# Patient Record
Sex: Male | Born: 1985 | Race: Black or African American | Hispanic: No | Marital: Single | Smoking: Never smoker
Health system: Southern US, Community
[De-identification: ages and names within clinical notes are randomized; demographics above are authoritative.]

---

## 2017-12-07 ENCOUNTER — Other Ambulatory Visit: Payer: Self-pay | Admitting: Obstetrics & Gynecology

## 2017-12-07 DIAGNOSIS — Z832 Family history of diseases of the blood and blood-forming organs and certain disorders involving the immune mechanism: Secondary | ICD-10-CM

## 2017-12-07 NOTE — Progress Notes (Signed)
Patient is here for hemoglobin evaluation; his fiancee who is pregnant has alpha thalassemia trait.  Will follow up results and manage accordingly.  Jaynie Collins, MD, FACOG Obstetrician & Gynecologist, [facility] for [employer], [facility]

## 2017-12-09 LAB — HEMOGLOBINOPATHY EVALUATION
HGB C: 0 %
HGB S: 0 %
HGB VARIANT: 0 %
Hemoglobin A2 Quantitation: 2.1 % (ref 1.8–3.2)
Hemoglobin F Quantitation: 0 % (ref 0.0–2.0)
Hgb A: 97.9 % (ref 96.4–98.8)

## 2017-12-13 ENCOUNTER — Telehealth: Payer: Self-pay

## 2017-12-13 NOTE — Telephone Encounter (Signed)
Notified pt's that the FOB results are negative.  Pt stated understanding with no further questions.

## 2017-12-13 NOTE — Telephone Encounter (Signed)
-----   Message from Tereso Newcomer, MD sent at 12/12/2017  1:20 PM EDT ----- Normal Hemoglobin AA. He is patient's FOB (patient with thalessemia trait). No further testing needed. Please call to inform patient of results and recommendations.

## 2018-01-29 ENCOUNTER — Encounter (HOSPITAL_BASED_OUTPATIENT_CLINIC_OR_DEPARTMENT_OTHER): Payer: Self-pay | Admitting: *Deleted

## 2018-01-29 ENCOUNTER — Emergency Department (HOSPITAL_BASED_OUTPATIENT_CLINIC_OR_DEPARTMENT_OTHER)
Admission: EM | Admit: 2018-01-29 | Discharge: 2018-01-29 | Disposition: A | Discharge status: Home / Self Care | Payer: Self-pay | Attending: Emergency Medicine | Admitting: Emergency Medicine

## 2018-01-29 ENCOUNTER — Other Ambulatory Visit: Payer: Self-pay

## 2018-01-29 DIAGNOSIS — K029 Dental caries, unspecified: Secondary | ICD-10-CM | POA: Insufficient documentation

## 2018-01-29 MED ORDER — KETOROLAC TROMETHAMINE 30 MG/ML IJ SOLN
30.0000 mg | Freq: Once | INTRAMUSCULAR | Status: AC
  Administered 2018-01-29: 30 mg via INTRAMUSCULAR
  Filled 2018-01-29: qty 1

## 2018-01-29 MED ORDER — NAPROXEN 500 MG PO TABS: 500.0000 mg | ORAL_TABLET | Freq: Two times a day (BID) | ORAL | 0 refills | Status: DC

## 2018-01-29 MED ORDER — PENICILLIN V POTASSIUM 500 MG PO TABS: 500.0000 mg | ORAL_TABLET | Freq: Four times a day (QID) | ORAL | 0 refills | Status: AC

## 2018-01-29 NOTE — ED Triage Notes (Signed)
Left upper toothache x 3 days

## 2018-01-29 NOTE — ED Provider Notes (Signed)
[facility] EMERGENCY DEPARTMENT Provider Note   CSN: [id number] Arrival date & time: 01/29/18  1953     History   Chief Complaint Chief Complaint  Patient presents with  . Dental Pain    HPI Mark Bryant is a 32 y.o. male presenting to the ED complaining of dental pain located left upper molar. Pt states pain began 3 days ago, described as throbbing and radiating to his lower jaw. He reports assoc bad taste in his mouth. Has taken tylenol and ibuprofen with mild relief of symptoms. Denies difficulty swallowing or breathing, fever, chills, N/V, or other symptoms today. Does not have a dentist.   The history is provided by the patient.    History reviewed. No pertinent past medical history.  There are no active problems to display for this patient.   History reviewed. No pertinent surgical history.      Home Medications    Prior to Admission medications   Medication Sig Start Date End Date Taking? Authorizing Provider  naproxen (NAPROSYN) 500 MG tablet Take 1 tablet (500 mg total) by mouth 2 (two) times daily with a meal. 01/29/18   Robinson, [place] N, PA-C  penicillin v potassium (VEETID) 500 MG tablet Take 1 tablet (500 mg total) by mouth 4 (four) times daily for 7 days. 01/29/18 02/05/18  Robinson, [place] N, PA-C    Family History No family history on file.  Social History Social History   Tobacco Use  . Smoking status: Never Smoker  . Smokeless tobacco: Never Used  Substance Use Topics  . Alcohol use: Yes  . Drug use: Yes    Types: Marijuana     Allergies   Patient has no known allergies.   Review of Systems Review of Systems  Constitutional: Negative for fever.  HENT: Positive for dental problem.      Physical Exam Updated Vital Signs BP (!) 128/91 (BP Location: Left Arm)   Pulse (!) 55   Temp 98.6 F (37 C) (Oral)   Resp 18   Ht 6\' 1"  (1.854 m)   Wt 131.5 kg   SpO2 100%   BMI 38.26 kg/m   Physical Exam  Constitutional:  He appears well-developed and well-nourished.  HENT:  Head: Normocephalic and atraumatic.  Multiple silver fillings. 2nd to last left upper molar with filling and small hole. Tooth is tender. No gingival abscess. No sublingual edema or tenderness. Uvula midline, no trismus.  Eyes: Conjunctivae are normal.  Neck: Normal range of motion. Neck supple.  Cardiovascular:  slightly bradycardic   Pulmonary/Chest: Effort normal.  Lymphadenopathy:    He has no cervical adenopathy.  Psychiatric: He has a normal mood and affect. His behavior is normal.  Nursing note and vitals reviewed.    ED Treatments / Results  Labs (all labs ordered are listed, but only abnormal results are displayed) Labs Reviewed - No data to display  EKG None  Radiology No results found.  Procedures Procedures (including critical care time)  Medications Ordered in ED Medications  ketorolac (TORADOL) 30 MG/ML injection 30 mg (has no administration in time range)     Initial Impression / Assessment and Plan / ED Course  I have reviewed the triage vital signs and the nursing notes.  Pertinent labs & imaging results that were available during my care of the patient were reviewed by me and considered in my medical decision making (see chart for details).    Patient with dental caries.  No gross abscess.  VSS, afebrile,  tolerating secretions. Exam unconcerning for peritonsillar abscess, Ludwig's angina or spread of infection.  Will treat with penicillin and pain medicine.  Urged patient to follow-up with dentist. Pt safe for discharge.  Discussed results, findings, treatment and follow up. Patient advised of return precautions. Patient verbalized understanding and agreed with plan.  Final Clinical Impressions(s) / ED Diagnoses   Final diagnoses:  Pain due to dental caries    ED Discharge Orders         Ordered    naproxen (NAPROSYN) 500 MG tablet  2 times daily with meals     01/29/18 2030    penicillin  v potassium (VEETID) 500 MG tablet  4 times daily     01/29/18 2030           Robinson, [place] N, PA-C 01/29/18 2034    Loren Racer, MD 01/29/18 2330

## 2018-01-29 NOTE — Discharge Instructions (Signed)
Please read instructions below. °Take the antibiotic, Penicillin V, 4 times per day until they are gone. °You can take Naproxen up to 2 times per day with meals, as needed for pain. °Schedule an appointment with a dentist, using the dental resource guide attached. °Return to the ER for difficulty swallowing or breathing, fever, or new or worsening symptoms. ° °

## 2018-01-29 NOTE — ED Notes (Signed)
Pt understood dc material. NAD noted. Scripts given at dc 

## 2019-09-20 ENCOUNTER — Emergency Department (HOSPITAL_BASED_OUTPATIENT_CLINIC_OR_DEPARTMENT_OTHER): Payer: Self-pay

## 2019-09-20 ENCOUNTER — Other Ambulatory Visit: Payer: Self-pay

## 2019-09-20 ENCOUNTER — Encounter (HOSPITAL_BASED_OUTPATIENT_CLINIC_OR_DEPARTMENT_OTHER): Payer: Self-pay | Admitting: Emergency Medicine

## 2019-09-20 ENCOUNTER — Emergency Department (HOSPITAL_BASED_OUTPATIENT_CLINIC_OR_DEPARTMENT_OTHER)
Admission: EM | Admit: 2019-09-20 | Discharge: 2019-09-20 | Disposition: A | Discharge status: Home / Self Care | Payer: Self-pay | Attending: Emergency Medicine | Admitting: Emergency Medicine

## 2019-09-20 DIAGNOSIS — F1210 Cannabis abuse, uncomplicated: Secondary | ICD-10-CM | POA: Insufficient documentation

## 2019-09-20 DIAGNOSIS — S161XXA Strain of muscle, fascia and tendon at neck level, initial encounter: Secondary | ICD-10-CM

## 2019-09-20 DIAGNOSIS — M546 Pain in thoracic spine: Secondary | ICD-10-CM | POA: Insufficient documentation

## 2019-09-20 DIAGNOSIS — M25511 Pain in right shoulder: Secondary | ICD-10-CM

## 2019-09-20 DIAGNOSIS — M791 Myalgia, unspecified site: Secondary | ICD-10-CM | POA: Insufficient documentation

## 2019-09-20 DIAGNOSIS — Y92410 Unspecified street and highway as the place of occurrence of the external cause: Secondary | ICD-10-CM | POA: Insufficient documentation

## 2019-09-20 DIAGNOSIS — Y999 Unspecified external cause status: Secondary | ICD-10-CM | POA: Insufficient documentation

## 2019-09-20 DIAGNOSIS — R52 Pain, unspecified: Secondary | ICD-10-CM

## 2019-09-20 DIAGNOSIS — Y939 Activity, unspecified: Secondary | ICD-10-CM | POA: Insufficient documentation

## 2019-09-20 DIAGNOSIS — Z041 Encounter for examination and observation following transport accident: Secondary | ICD-10-CM | POA: Insufficient documentation

## 2019-09-20 DIAGNOSIS — G8929 Other chronic pain: Secondary | ICD-10-CM | POA: Insufficient documentation

## 2019-09-20 DIAGNOSIS — M25561 Pain in right knee: Secondary | ICD-10-CM

## 2019-09-20 MED ORDER — KETOROLAC TROMETHAMINE 15 MG/ML IJ SOLN
15.0000 mg | Freq: Once | INTRAMUSCULAR | Status: DC
  Filled 2019-09-20: qty 1

## 2019-09-20 MED ORDER — HYDROMORPHONE HCL 1 MG/ML IJ SOLN
1.0000 mg | Freq: Once | INTRAMUSCULAR | Status: AC
  Administered 2019-09-20: 1 mg via INTRAVENOUS
  Filled 2019-09-20: qty 1

## 2019-09-20 MED ORDER — HYDROMORPHONE HCL 1 MG/ML IJ SOLN
1.0000 mg | Freq: Once | INTRAMUSCULAR | Status: DC
  Filled 2019-09-20: qty 1

## 2019-09-20 MED ORDER — LORAZEPAM 2 MG/ML IJ SOLN
2.0000 mg | Freq: Once | INTRAMUSCULAR | Status: DC
  Filled 2019-09-20: qty 1

## 2019-09-20 MED ORDER — DIAZEPAM 5 MG PO TABS: 5.0000 mg | ORAL_TABLET | Freq: Three times a day (TID) | ORAL | 0 refills | Status: AC | PRN

## 2019-09-20 MED ORDER — OXYCODONE-ACETAMINOPHEN 5-325 MG PO TABS: 1.0000 | ORAL_TABLET | Freq: Four times a day (QID) | ORAL | 0 refills | Status: AC | PRN

## 2019-09-20 NOTE — Discharge Instructions (Signed)
It is wonderful to meet you today.  The CT of your neck and x-rays of your shoulder and knee were normal.  We sent in higher strength pain and muscle spasm medications for you to use.  Also recommend using Tylenol and ibuprofen on a scheduled basis.  Ice/heat to your shoulder/knee for 20 minutes at a time several times a day.  Please call the sports medicine clinic tomorrow morning to schedule follow-up visit.  Follow-up if pain not improving or sooner if worsening, any weakness/numbness.
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
## 2019-09-20 NOTE — ED Notes (Signed)
Pt states he does not want any more medication. He wants to go home. MD made aware.
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
## 2019-09-20 NOTE — ED Notes (Signed)
Patient transported to CT 

## 2020-07-17 ENCOUNTER — Emergency Department (HOSPITAL_BASED_OUTPATIENT_CLINIC_OR_DEPARTMENT_OTHER)
Admission: EM | Admit: 2020-07-17 | Discharge: 2020-07-17 | Disposition: A | Discharge status: Home / Self Care | Payer: Self-pay | Attending: Emergency Medicine | Admitting: Emergency Medicine

## 2020-07-17 ENCOUNTER — Emergency Department (HOSPITAL_BASED_OUTPATIENT_CLINIC_OR_DEPARTMENT_OTHER): Payer: Self-pay

## 2020-07-17 ENCOUNTER — Encounter (HOSPITAL_BASED_OUTPATIENT_CLINIC_OR_DEPARTMENT_OTHER): Payer: Self-pay

## 2020-07-17 ENCOUNTER — Other Ambulatory Visit: Payer: Self-pay

## 2020-07-17 DIAGNOSIS — X500XXA Overexertion from strenuous movement or load, initial encounter: Secondary | ICD-10-CM | POA: Insufficient documentation

## 2020-07-17 DIAGNOSIS — R079 Chest pain, unspecified: Secondary | ICD-10-CM

## 2020-07-17 DIAGNOSIS — M549 Dorsalgia, unspecified: Secondary | ICD-10-CM | POA: Insufficient documentation

## 2020-07-17 DIAGNOSIS — R0781 Pleurodynia: Secondary | ICD-10-CM | POA: Insufficient documentation

## 2020-07-17 LAB — CBC
HCT: 43.7 % (ref 39.0–52.0)
Hemoglobin: 13.5 g/dL (ref 13.0–17.0)
MCH: 27.6 pg (ref 26.0–34.0)
MCHC: 30.9 g/dL (ref 30.0–36.0)
MCV: 89.2 fL (ref 80.0–100.0)
Platelets: 282 10*3/uL (ref 150–400)
RBC: 4.9 MIL/uL (ref 4.22–5.81)
RDW: 12.9 % (ref 11.5–15.5)
WBC: 8.4 10*3/uL (ref 4.0–10.5)
nRBC: 0 % (ref 0.0–0.2)

## 2020-07-17 LAB — BASIC METABOLIC PANEL
Anion gap: 10 (ref 5–15)
BUN: 13 mg/dL (ref 6–20)
CO2: 24 mmol/L (ref 22–32)
Calcium: 9.3 mg/dL (ref 8.9–10.3)
Chloride: 100 mmol/L (ref 98–111)
Creatinine, Ser: 1.07 mg/dL (ref 0.61–1.24)
GFR, Estimated: >60 mL/min (ref >60)
Glucose, Bld: 100 mg/dL — ABNORMAL HIGH (ref 70–99)
Potassium: 3.8 mmol/L (ref 3.5–5.1)
Sodium: 134 mmol/L — ABNORMAL LOW (ref 135–145)

## 2020-07-17 LAB — RAPID URINE DRUG SCREEN, HOSP PERFORMED
Amphetamines: NOT DETECTED
Barbiturates: NOT DETECTED
Benzodiazepines: NOT DETECTED
Cocaine: NOT DETECTED
Opiates: NOT DETECTED
Tetrahydrocannabinol: POSITIVE — AB

## 2020-07-17 LAB — TROPONIN I (HIGH SENSITIVITY): Troponin I (High Sensitivity): 6 ng/L (ref <18)

## 2020-07-17 MED ORDER — ACETAMINOPHEN 500 MG PO TABS
1000.0000 mg | ORAL_TABLET | Freq: Once | ORAL | Status: AC
  Administered 2020-07-17: 1000 mg via ORAL
  Filled 2020-07-17: qty 2

## 2020-07-17 MED ORDER — KETOROLAC TROMETHAMINE 30 MG/ML IJ SOLN
30.0000 mg | Freq: Once | INTRAMUSCULAR | Status: AC
  Administered 2020-07-17: 30 mg via INTRAVENOUS
  Filled 2020-07-17: qty 1

## 2020-07-17 MED ORDER — FENTANYL CITRATE (PF) 100 MCG/2ML IJ SOLN
50.0000 ug | INTRAMUSCULAR | Status: DC | PRN
  Administered 2020-07-17: 50 ug via INTRAVENOUS
  Filled 2020-07-17: qty 2

## 2020-07-17 MED ORDER — METHOCARBAMOL 500 MG PO TABS
500.0000 mg | ORAL_TABLET | Freq: Two times a day (BID) | ORAL | 0 refills | Status: DC
Start: 2020-07-17 — End: 2022-02-21

## 2020-07-17 NOTE — ED Notes (Signed)
Patient transported to X-ray 

## 2020-07-17 NOTE — ED Triage Notes (Signed)
States chest pain began this morning in his left chest radiating to his back.

## 2020-07-17 NOTE — Discharge Instructions (Signed)
Your work-up today was reassuring.  Please drink plenty of water.  Use warm compresses to your side.  Use the muscle relaxer as prescribed.  Please use Tylenol or ibuprofen for pain.  You may use 600 mg ibuprofen every 6 hours or 1000 mg of Tylenol every 6 hours.  You may choose to alternate between the 2.  This would be most effective.  Not to exceed 4 g of Tylenol within 24 hours.  Not to exceed 3200 mg ibuprofen 24 hours.

## 2020-07-17 NOTE — ED Provider Notes (Signed)
[facility] EMERGENCY DEPARTMENT Provider Note   CSN: [id number] Arrival date & time: 07/17/20  1737     History Chief Complaint  Patient presents with  . Chest Pain    Mark Bryant is a 35 y.o. male.  HPI Patient 35 year old male.  Presented today for chest pain.  He states that it began on Sunday when he struck a table into the left side of his chest.  While moving furniture.  He states that since that time he has had some achy pain.  He states that today he got much worse however.  He states that it began this morning feeling worse.  He describes as sharp and achy.  Seems to also have some pain in his left side of his back.  Denies any nausea vomiting diaphoresis, shortness of breath, lightheadedness dizziness. No recent surgeries, hospitalization, long travel, hemoptysis, estrogen containing OCP, cancer history.  No unilateral leg swelling.  No history of PE or VTE.      History reviewed. No pertinent past medical history.  There are no problems to display for this patient.   History reviewed. No pertinent surgical history.     History reviewed. No pertinent family history.  Social History   Tobacco Use  . Smoking status: Never Smoker  . Smokeless tobacco: Never Used  Vaping Use  . Vaping Use: Never used  Substance Use Topics  . Alcohol use: Yes  . Drug use: Yes    Types: Marijuana    Home Medications Prior to Admission medications   Medication Sig Start Date End Date Taking? Authorizing Provider  methocarbamol (ROBAXIN) 500 MG tablet Take 1 tablet (500 mg total) by mouth 2 (two) times daily. 07/17/20  Yes Rolf Fells, Stevphen Meuse S, PA  naproxen (NAPROSYN) 500 MG tablet Take 1 tablet (500 mg total) by mouth 2 (two) times daily with a meal. 01/29/18   Robinson, [place] N, PA-C  oxyCODONE-acetaminophen (PERCOCET) 5-325 MG tablet Take 1 tablet by mouth every 6 (six) hours as needed for severe pain. 09/20/19 09/19/20  Allayne Stack, DO    Allergies    Patient  has no known allergies.  Review of Systems   Review of Systems  Constitutional: Negative for chills and fever.  HENT: Negative for congestion.   Eyes: Negative for pain.  Respiratory: Negative for cough and shortness of breath.   Cardiovascular: Positive for chest pain. Negative for leg swelling.  Gastrointestinal: Negative for abdominal pain, diarrhea, nausea and vomiting.  Genitourinary: Negative for dysuria.  Musculoskeletal: Negative for myalgias.  Skin: Negative for rash.  Neurological: Negative for dizziness and headaches.    Physical Exam Updated Vital Signs BP (!) 125/95 (BP Location: Right Arm)   Pulse 60   Temp 97.7 F (36.5 C) (Oral)   Resp 19   Ht 6\' 1"  (1.854 m)   Wt 136.1 kg   SpO2 97%   BMI 39.58 kg/m   Physical Exam Vitals and nursing note reviewed.  Constitutional:      General: He is not in acute distress.    Appearance: He is obese.  HENT:     Head: Normocephalic and atraumatic.     Nose: Nose normal.     Mouth/Throat:     Mouth: Mucous membranes are moist.  Eyes:     General: No scleral icterus. Cardiovascular:     Rate and Rhythm: Normal rate and regular rhythm.     Pulses: Normal pulses.     Heart sounds: Normal heart sounds.  Comments: Bilateral radial artery pulses 3+ and symmetric Pulmonary:     Effort: Pulmonary effort is normal. No respiratory distress.     Breath sounds: Normal breath sounds. No wheezing.     Comments: Reproducible left-sided pectoral muscle tenderness palpation reproduces patient's symptoms.  Also some left-sided fourth/fifth rib tenderness to palpation on the lateral side of the chest. Abdominal:     Palpations: Abdomen is soft.     Tenderness: There is no abdominal tenderness. There is no guarding or rebound.  Musculoskeletal:     Cervical back: Normal range of motion.     Right lower leg: No edema.     Left lower leg: No edema.  Skin:    General: Skin is warm and dry.     Capillary Refill: Capillary refill  takes less than 2 seconds.  Neurological:     Mental Status: He is alert. Mental status is at baseline.  Psychiatric:        Mood and Affect: Mood normal.        Behavior: Behavior normal.     ED Results / Procedures / Treatments   Labs (all labs ordered are listed, but only abnormal results are displayed) Labs Reviewed  BASIC METABOLIC PANEL - Abnormal; Notable for the following components:      Result Value   Sodium 134 (*)    Glucose, Bld 100 (*)    All other components within normal limits  RAPID URINE DRUG SCREEN, HOSP PERFORMED - Abnormal; Notable for the following components:   Tetrahydrocannabinol POSITIVE (*)    All other components within normal limits  CBC  TROPONIN I (HIGH SENSITIVITY)    EKG EKG Interpretation  Date/Time:  Thursday July 17 2020 17:49:18 EDT Ventricular Rate:  71 PR Interval:  164 QRS Duration: 78 QT Interval:  374 QTC Calculation: 406 R Axis:   77 Text Interpretation: Normal sinus rhythm Normal ECG No previous ECGs available Confirmed by Frederick Peers [phone]) on 07/17/2020 6:14:40 PM   Radiology DG Ribs Unilateral W/Chest Left  Result Date: 07/17/2020 CLINICAL DATA:  35 year old who struck his LEFT ribs approximately 1 week ago when moving a metal chair. Acute onset of LEFT-sided chest pain that began this morning. EXAM: LEFT RIBS AND CHEST - 3+ VIEW COMPARISON:  None. FINDINGS: The site of maximum pain and tenderness was marked with a small metallic BB. No LEFT rib fractures identified. No intrinsic osseous abnormalities. Suboptimal inspiration accounts for crowded bronchovascular markings diffusely and atelectasis in the bases, and accentuates the cardiac silhouette. Taking this into account, cardiomediastinal silhouette unremarkable. Lungs otherwise clear. Pulmonary vascularity normal. Bronchovascular markings normal. No visible pleural effusions. No pneumothorax. IMPRESSION: 1. No LEFT rib fractures identified. 2. Suboptimal inspiration  which accounts for mild bibasilar atelectasis. No acute cardiopulmonary disease otherwise. Electronically Signed   By: Hulan Saas M.D.   On: 07/17/2020 19:12    Procedures Procedures   Medications Ordered in ED Medications  fentaNYL (SUBLIMAZE) injection 50 mcg (50 mcg Intravenous Given 07/17/20 1851)  acetaminophen (TYLENOL) tablet 1,000 mg (1,000 mg Oral Given 07/17/20 1828)  ketorolac (TORADOL) 30 MG/ML injection 30 mg (30 mg Intravenous Given 07/17/20 1956)    ED Course  I have reviewed the triage vital signs and the nursing notes.  Pertinent labs & imaging results that were available during my care of the patient were reviewed by me and considered in my medical decision making (see chart for details).  Patient 36 year old male.  Presented today for chest pain.  He  states that it began on "Sunday when he struck a table into the left side of his chest.  While moving furniture.  He states that since that time he has had some achy pain.  He states that today he got much worse however.  He states that it began this morning feeling worse.  He describes as sharp and achy.  Seems to also have some pain in his left side of his back.  Denies any nausea vomiting diaphoresis, shortness of breath, lightheadedness dizziness. No recent surgeries, hospitalization, long travel, hemoptysis, estrogen containing OCP, cancer history.  No unilateral leg swelling.  No history of PE or VTE.   Patient is PERC negative I have low suspicion for PE.  Vital signs are within normal limits apart from blood pressure which is mildly elevated.  Patient tachypneic when he arrived seems to be secondary to pain.  He becomes much more tachypneic when I am pressing on his left pectoral muscle.  Suspect musculoskeletal origin of patient's pain.  Multiple rib fracture.  Will obtain x-rays.  Basic lab work, troponin and anticipate discharge home with Robaxin.  Clinical Course as of 07/18/20 0037  Thu Jul 17, 2020  1948  Troponin x1 is 6.  BMP unremarkable.  CBC ovalocytosis.  Imaging reviewed x-ray is without abnormality.  No infiltrate on chest x-ray and there is no evidence of rib fracture.  EKG is unremarkable normal sinus rhythm with no ST-T wave abnormalities.  No tachycardia or hypoxia.  Patient is PERC negative.  Low suspicion for pulmonary embolism.  Very low heart score given his age, lack of any prescribing factors and his chest pain is very atypical in that it is very muscular in presentation.  Reproducible of palpation.  Also seems to be painful with movement but is not exertional. [WF]    Clinical Course User Index [WF] Delcenia Inman S, PA   MDM Rules/Calculators/A&P                          Patient reassessed.  He feels much better after single dose of fentanyl. Will provide patient with Toradol, Tylenol and discharged with Robaxin.  All questions answered best my ability.  Final Clinical Impression(s) / ED Diagnoses Final diagnoses:  Chest pain, unspecified type  Rib pain on left side    Rx / DC Orders ED Discharge Orders         Ordered    methocarbamol (ROBAXIN) 500 MG tablet  2 times daily        04" /14/22 1944           04-07-1970, Gailen Shelter 07/18/20 0038    Little, 07/20/20, MD 07/20/20 2059

## 2020-09-16 ENCOUNTER — Emergency Department (HOSPITAL_BASED_OUTPATIENT_CLINIC_OR_DEPARTMENT_OTHER): Payer: Self-pay

## 2020-09-16 ENCOUNTER — Other Ambulatory Visit: Payer: Self-pay

## 2020-09-16 ENCOUNTER — Emergency Department (HOSPITAL_BASED_OUTPATIENT_CLINIC_OR_DEPARTMENT_OTHER)
Admission: EM | Admit: 2020-09-16 | Discharge: 2020-09-16 | Disposition: A | Discharge status: Home / Self Care | Payer: Self-pay | Attending: Emergency Medicine | Admitting: Emergency Medicine

## 2020-09-16 DIAGNOSIS — U071 COVID-19: Secondary | ICD-10-CM | POA: Insufficient documentation

## 2020-09-16 LAB — BASIC METABOLIC PANEL
Anion gap: 7 (ref 5–15)
BUN: 11 mg/dL (ref 6–20)
CO2: 26 mmol/L (ref 22–32)
Calcium: 8.9 mg/dL (ref 8.9–10.3)
Chloride: 100 mmol/L (ref 98–111)
Creatinine, Ser: 1.13 mg/dL (ref 0.61–1.24)
GFR, Estimated: >60 mL/min (ref >60)
Glucose, Bld: 97 mg/dL (ref 70–99)
Potassium: 4 mmol/L (ref 3.5–5.1)
Sodium: 133 mmol/L — ABNORMAL LOW (ref 135–145)

## 2020-09-16 LAB — HEPATIC FUNCTION PANEL
ALT: 47 U/L — ABNORMAL HIGH (ref 0–44)
AST: 68 U/L — ABNORMAL HIGH (ref 15–41)
Albumin: 4.3 g/dL (ref 3.5–5.0)
Alkaline Phosphatase: 67 U/L (ref 38–126)
Bilirubin, Direct: 0.2 mg/dL (ref 0.0–0.2)
Indirect Bilirubin: 1.1 mg/dL — ABNORMAL HIGH (ref 0.3–0.9)
Total Bilirubin: 1.3 mg/dL — ABNORMAL HIGH (ref 0.3–1.2)
Total Protein: 8.1 g/dL (ref 6.5–8.1)

## 2020-09-16 LAB — CBC
HCT: 41 % (ref 39.0–52.0)
Hemoglobin: 12.8 g/dL — ABNORMAL LOW (ref 13.0–17.0)
MCH: 27.4 pg (ref 26.0–34.0)
MCHC: 31.2 g/dL (ref 30.0–36.0)
MCV: 87.6 fL (ref 80.0–100.0)
Platelets: 252 10*3/uL (ref 150–400)
RBC: 4.68 MIL/uL (ref 4.22–5.81)
RDW: 12.9 % (ref 11.5–15.5)
WBC: 6.9 10*3/uL (ref 4.0–10.5)
nRBC: 0 % (ref 0.0–0.2)

## 2020-09-16 LAB — TROPONIN I (HIGH SENSITIVITY): Troponin I (High Sensitivity): 10 ng/L (ref <18)

## 2020-09-16 LAB — RESP PANEL BY RT-PCR (FLU A&B, COVID) ARPGX2
Influenza A by PCR: NEGATIVE
Influenza B by PCR: NEGATIVE
SARS Coronavirus 2 by RT PCR: POSITIVE — AB

## 2020-09-16 LAB — LIPASE, BLOOD: Lipase: 28 U/L (ref 11–51)

## 2020-09-16 MED ORDER — ACETAMINOPHEN 500 MG PO TABS
1000.0000 mg | ORAL_TABLET | Freq: Once | ORAL | Status: AC
  Administered 2020-09-16: 1000 mg via ORAL
  Filled 2020-09-16: qty 2

## 2020-09-16 MED ORDER — IBUPROFEN 800 MG PO TABS
800.0000 mg | ORAL_TABLET | Freq: Once | ORAL | Status: AC
  Administered 2020-09-16: 800 mg via ORAL
  Filled 2020-09-16: qty 1

## 2020-09-16 MED ORDER — SODIUM CHLORIDE 0.9 % IV BOLUS
1000.0000 mL | Freq: Once | INTRAVENOUS | Status: AC
  Administered 2020-09-16: 1000 mL via INTRAVENOUS

## 2020-09-16 NOTE — ED Provider Notes (Signed)
[facility] EMERGENCY DEPARTMENT Provider Note   CSN: [id number] Arrival date & time: 09/16/20  1054     History Chief Complaint  Patient presents with   Chest Pain    Mark Bryant is a 35 y.o. male.  The history is provided by the patient.  URI Presenting symptoms: congestion and cough   Presenting symptoms: no ear pain, no fever and no sore throat   Congestion:    Location:  Chest Cough:    Cough characteristics:  Non-productive   Sputum characteristics:  Nondescript   Severity:  Mild   Onset quality:  Gradual   Duration:  2 days   Timing:  Intermittent   Progression:  Waxing and waning   Chronicity:  New Severity:  Mild Onset quality:  Gradual Timing:  Constant Progression:  Unchanged Relieved by:  Nothing Worsened by:  Nothing Ineffective treatments:  OTC medications Associated symptoms: myalgias   Associated symptoms: no arthralgias   Risk factors: sick contacts       No past medical history on file.  There are no problems to display for this patient.   No past surgical history on file.     No family history on file.  Social History   Tobacco Use   Smoking status: Never   Smokeless tobacco: Never  Vaping Use   Vaping Use: Never used  Substance Use Topics   Alcohol use: Yes   Drug use: Yes    Types: Marijuana    Home Medications Prior to Admission medications   Medication Sig Start Date End Date Taking? Authorizing Provider  methocarbamol (ROBAXIN) 500 MG tablet Take 1 tablet (500 mg total) by mouth 2 (two) times daily. 07/17/20   Gailen Shelter, PA  naproxen (NAPROSYN) 500 MG tablet Take 1 tablet (500 mg total) by mouth 2 (two) times daily with a meal. 01/29/18   Robinson, [place] N, PA-C  oxyCODONE-acetaminophen (PERCOCET) 5-325 MG tablet Take 1 tablet by mouth every 6 (six) hours as needed for severe pain. 09/20/19 09/19/20  Allayne Stack, DO    Allergies    Patient has no known allergies.  Review of Systems   Review  of Systems  Constitutional:  Negative for chills and fever.  HENT:  Positive for congestion. Negative for ear pain and sore throat.   Eyes:  Negative for pain and visual disturbance.  Respiratory:  Positive for cough. Negative for shortness of breath.   Cardiovascular:  Positive for chest pain. Negative for palpitations.  Gastrointestinal:  Negative for abdominal pain and vomiting.  Genitourinary:  Negative for dysuria and hematuria.  Musculoskeletal:  Positive for myalgias. Negative for arthralgias and back pain.  Skin:  Negative for color change and rash.  Allergic/Immunologic: Negative for immunocompromised state.  Neurological:  Negative for seizures and syncope.  Psychiatric/Behavioral:  The patient is not nervous/anxious.   All other systems reviewed and are negative.  Physical Exam Updated Vital Signs  ED Triage Vitals  Enc Vitals Group     BP 09/16/20 1101 (!) 154/100     Pulse Rate 09/16/20 1101 80     Resp 09/16/20 1101 20     Temp 09/16/20 1101 100 F (37.8 C)     Temp Source 09/16/20 1101 Oral     SpO2 09/16/20 1101 95 %     Weight 09/16/20 1059 300 lb (136.1 kg)     Height 09/16/20 1059 6\' 1"  (1.854 m)     Head Circumference --  Peak Flow --      Pain Score 09/16/20 1059 7     Pain Loc --      Pain Edu? --      Excl. in GC? --      Physical Exam Vitals and nursing note reviewed.  Constitutional:      General: Mark Bryant is not in acute distress.    Appearance: Mark Bryant is well-developed. Mark Bryant is not ill-appearing.  HENT:     Head: Normocephalic and atraumatic.  Eyes:     Extraocular Movements: Extraocular movements intact.     Conjunctiva/sclera: Conjunctivae normal.     Pupils: Pupils are equal, round, and reactive to light.  Cardiovascular:     Rate and Rhythm: Normal rate and regular rhythm.     Pulses:          Radial pulses are 2+ on the right side and 2+ on the left side.     Heart sounds: Normal heart sounds. No murmur heard. Pulmonary:     Effort:  Pulmonary effort is normal. No respiratory distress.     Breath sounds: Normal breath sounds. No decreased breath sounds, wheezing or rhonchi.  Chest:     Chest wall: No tenderness.  Abdominal:     General: There is no abdominal bruit.     Palpations: Abdomen is soft.     Tenderness: There is no abdominal tenderness.  Musculoskeletal:        General: Normal range of motion.     Cervical back: Normal range of motion and neck supple. No erythema.     Right lower leg: No edema.     Left lower leg: No edema.  Lymphadenopathy:     Cervical: No cervical adenopathy.  Skin:    General: Skin is warm and dry.     Capillary Refill: Capillary refill takes less than 2 seconds.  Neurological:     General: No focal deficit present.     Mental Status: Mark Bryant is alert.  Psychiatric:        Mood and Affect: Mood normal.    ED Results / Procedures / Treatments   Labs (all labs ordered are listed, but only abnormal results are displayed) Labs Reviewed  RESP PANEL BY RT-PCR (FLU A&B, COVID) ARPGX2 - Abnormal; Notable for the following components:      Result Value   SARS Coronavirus 2 by RT PCR POSITIVE (*)    All other components within normal limits  CBC - Abnormal; Notable for the following components:   Hemoglobin 12.8 (*)    All other components within normal limits  BASIC METABOLIC PANEL  HEPATIC FUNCTION PANEL  LIPASE, BLOOD  TROPONIN I (HIGH SENSITIVITY)    EKG EKG Interpretation  Date/Time:  Tuesday September 16 2020 11:02:20 EDT Ventricular Rate:  80 PR Interval:  159 QRS Duration: 87 QT Interval:  344 QTC Calculation: 397 R Axis:   58 Text Interpretation: Sinus rhythm Probable left atrial enlargement Confirmed by Virgina Norfolk ([id number]) on 09/16/2020 11:04:24 AM Also confirmed by Virgina Norfolk ([id number]), editor [place], Beverly ([id number])  on 09/16/2020 11:41:48 AM  Radiology DG Chest 2 View  Result Date: 09/16/2020 CLINICAL DATA:  Chest pain and short of breath EXAM: CHEST - 2 VIEW  COMPARISON:  07/17/2020 FINDINGS: The heart size and mediastinal contours are within normal limits. Both lungs are clear. The visualized skeletal structures are unremarkable. IMPRESSION: No active cardiopulmonary disease. Electronically Signed   By: Marlan Palau M.D.   On: 09/16/2020 11:27  Procedures Procedures   Medications Ordered in ED Medications  acetaminophen (TYLENOL) tablet 1,000 mg (1,000 mg Oral Given 09/16/20 1148)  sodium chloride 0.9 % bolus 1,000 mL (1,000 mLs Intravenous New Bag/Given 09/16/20 1132)    ED Course  I have reviewed the triage vital signs and the nursing notes.  Pertinent labs & imaging results that were available during my care of the patient were reviewed by me and considered in my medical decision making (see chart for details).    MDM Rules/Calculators/A&P                          Carthel Castille is here with chest pain, cough, congestion.  Recent sick contact.  Low-grade fever of 100 but otherwise normal vitals.  Took some OTC's medicine prior to arrival.  Patient with EKG shows sinus rhythm.  No ischemic changes.  Overall sounds like a viral process.  Lab work has been collected prior to my evaluation that includes troponin but doubt ACS.  We will get a chest x-ray, COVID test, other basic labs to evaluate for electrolyte issues, dehydration.  Seems less likely to be cholecystitis or pancreatitis.  Patient positive for COVID-19.  Chest x-ray without any abnormality.  Lab results otherwise unremarkable.  Recommend Tylenol, hydration.  No respiratory distress.  Discharged in good condition.  This chart was dictated using voice recognition software.  Despite best efforts to proofread,  errors can occur which can change the documentation meaning.   Final Clinical Impression(s) / ED Diagnoses Final diagnoses:  COVID-19    Rx / DC Orders ED Discharge Orders     None        Virgina Norfolk, DO 09/16/20 1205

## 2020-09-16 NOTE — ED Notes (Signed)
Sig other called to come and get pt , no answer left message

## 2020-09-16 NOTE — ED Notes (Signed)
pts sig  Other still not here yet after she called and told Asher Muir that she would be here by 215

## 2020-09-16 NOTE — Discharge Instructions (Addendum)
Recommend 1000 mg of Tylenol every 6 hours as needed for fever and body aches.  Stay hydrated.  Please return if respiratory symptoms worsen.  Chest x-ray today is normal.

## 2020-09-16 NOTE — ED Triage Notes (Signed)
Pt c/o left sided chest pain, dizziness, SOB with nausea. States feels unwell. Recent sick contact.

## 2020-09-16 NOTE — ED Notes (Signed)
Pt continues to wait for fiance to pick him up. NAD noted.

## 2020-11-22 ENCOUNTER — Emergency Department (HOSPITAL_BASED_OUTPATIENT_CLINIC_OR_DEPARTMENT_OTHER): Payer: Self-pay

## 2020-11-22 ENCOUNTER — Other Ambulatory Visit: Payer: Self-pay

## 2020-11-22 ENCOUNTER — Emergency Department (HOSPITAL_BASED_OUTPATIENT_CLINIC_OR_DEPARTMENT_OTHER)
Admission: EM | Admit: 2020-11-22 | Discharge: 2020-11-22 | Disposition: A | Discharge status: Home / Self Care | Payer: Self-pay | Attending: Emergency Medicine | Admitting: Emergency Medicine

## 2020-11-22 ENCOUNTER — Encounter (HOSPITAL_BASED_OUTPATIENT_CLINIC_OR_DEPARTMENT_OTHER): Payer: Self-pay | Admitting: Emergency Medicine

## 2020-11-22 DIAGNOSIS — Y92017 Garden or yard in single-family (private) house as the place of occurrence of the external cause: Secondary | ICD-10-CM | POA: Insufficient documentation

## 2020-11-22 DIAGNOSIS — W172XXA Fall into hole, initial encounter: Secondary | ICD-10-CM | POA: Insufficient documentation

## 2020-11-22 DIAGNOSIS — S93402A Sprain of unspecified ligament of left ankle, initial encounter: Secondary | ICD-10-CM | POA: Insufficient documentation

## 2020-11-22 DIAGNOSIS — M25572 Pain in left ankle and joints of left foot: Secondary | ICD-10-CM | POA: Insufficient documentation

## 2020-11-22 MED ORDER — IBUPROFEN 800 MG PO TABS
800.0000 mg | ORAL_TABLET | Freq: Once | ORAL | Status: AC
  Administered 2020-11-22: 800 mg via ORAL
  Filled 2020-11-22: qty 1

## 2020-11-22 MED ORDER — ACETAMINOPHEN 500 MG PO TABS
1000.0000 mg | ORAL_TABLET | Freq: Once | ORAL | Status: AC
  Administered 2020-11-22: 1000 mg via ORAL
  Filled 2020-11-22: qty 2

## 2020-11-22 MED ORDER — OXYCODONE HCL 5 MG PO TABS
5.0000 mg | ORAL_TABLET | Freq: Once | ORAL | Status: AC
  Administered 2020-11-22: 5 mg via ORAL
  Filled 2020-11-22: qty 1

## 2020-11-22 NOTE — ED Provider Notes (Signed)
[facility] EMERGENCY DEPARTMENT Provider Note   CSN: [id number] Arrival date & time: 11/22/20  7829     History Chief Complaint  Patient presents with   Ankle Pain    Mark Bryant is a 35 y.o. male.  35 yo M with a chief complaint of a fall.  The patient stepped into a hole in his yard and ended up falling down.  Complaining of pain and swelling to the left lower leg.  Denies any other injury. Pain with palpation and ambulation.  Denies pain in the foot or the knee.  The history is provided by the patient.  Ankle Pain Location:  Ankle Time since incident:  2 hours Injury: yes   Mechanism of injury: fall   Ankle location:  L ankle Pain details:    Quality:  Aching   Radiates to:  Does not radiate   Severity:  Moderate   Onset quality:  Gradual   Duration:  2 hours   Timing:  Constant   Progression:  Unchanged Chronicity:  New Prior injury to area:  No Relieved by:  Nothing Worsened by:  Bearing weight, extension, exercise and activity Ineffective treatments:  None tried Associated symptoms: no fever       History reviewed. No pertinent past medical history.  There are no problems to display for this patient.   History reviewed. No pertinent surgical history.     History reviewed. No pertinent family history.  Social History   Tobacco Use   Smoking status: Never   Smokeless tobacco: Never  Vaping Use   Vaping Use: Never used  Substance Use Topics   Alcohol use: Yes   Drug use: Yes    Types: Marijuana    Home Medications Prior to Admission medications   Medication Sig Start Date End Date Taking? Authorizing Provider  methocarbamol (ROBAXIN) 500 MG tablet Take 1 tablet (500 mg total) by mouth 2 (two) times daily. 07/17/20   Gailen Shelter, PA  naproxen (NAPROSYN) 500 MG tablet Take 1 tablet (500 mg total) by mouth 2 (two) times daily with a meal. 01/29/18   Robinson, [place] N, PA-C    Allergies    Patient has no known  allergies.  Review of Systems   Review of Systems  Constitutional:  Negative for chills and fever.  HENT:  Negative for congestion and facial swelling.   Eyes:  Negative for discharge and visual disturbance.  Respiratory:  Negative for shortness of breath.   Cardiovascular:  Negative for chest pain and palpitations.  Gastrointestinal:  Negative for abdominal pain, diarrhea and vomiting.  Musculoskeletal:  Positive for arthralgias. Negative for myalgias.  Skin:  Negative for color change and rash.  Neurological:  Negative for tremors, syncope and headaches.  Psychiatric/Behavioral:  Negative for confusion and dysphoric mood.    Physical Exam Updated Vital Signs BP 130/90 (BP Location: Right Arm)   Pulse 71   Temp 98.9 F (37.2 C) (Oral)   Resp 18   Ht 6\' 2"  (1.88 m)   Wt 136.1 kg   SpO2 94%   BMI 38.52 kg/m   Physical Exam Vitals and nursing note reviewed.  Constitutional:      Appearance: He is well-developed.  HENT:     Head: Normocephalic and atraumatic.  Eyes:     Pupils: Pupils are equal, round, and reactive to light.  Neck:     Vascular: No JVD.  Cardiovascular:     Rate and Rhythm: Normal rate and regular rhythm.  Heart sounds: No murmur heard.   No friction rub. No gallop.  Pulmonary:     Effort: No respiratory distress.     Breath sounds: No wheezing.  Abdominal:     General: There is no distension.     Tenderness: There is no abdominal tenderness. There is no guarding or rebound.  Musculoskeletal:        General: Swelling and tenderness present. Normal range of motion.     Cervical back: Normal range of motion and neck supple.     Comments: No pain to the base of the fifth metatarsal or the navicular.  No pain at the fibular neck.  Forage motion at the knee.  Pulse motor and sensation intact in the foot.  Pain and localized edema about 2 cm superior and medial to the lateral malleolus.  Skin:    Coloration: Skin is not pale.     Findings: No rash.   Neurological:     Mental Status: He is alert and oriented to person, place, and time.  Psychiatric:        Behavior: Behavior normal.    ED Results / Procedures / Treatments   Labs (all labs ordered are listed, but only abnormal results are displayed) Labs Reviewed - No data to display  EKG None  Radiology DG Ankle Complete Left  Result Date: 11/22/2020 CLINICAL DATA:  Left ankle pain after a fall EXAM: LEFT ANKLE COMPLETE - 3+ VIEW COMPARISON:  None. FINDINGS: No acute fracture or dislocation. Base of fifth metatarsal and talar dome intact. No significant soft tissue swelling. IMPRESSION: No acute osseous abnormality. Electronically Signed   By: Jeronimo Greaves M.D.   On: 11/22/2020 08:52    Procedures Procedures   Medications Ordered in ED Medications  acetaminophen (TYLENOL) tablet 1,000 mg (1,000 mg Oral Given 11/22/20 0813)  ibuprofen (ADVIL) tablet 800 mg (800 mg Oral Given 11/22/20 0813)  oxyCODONE (Oxy IR/ROXICODONE) immediate release tablet 5 mg (5 mg Oral Given 11/22/20 0813)    ED Course  I have reviewed the triage vital signs and the nursing notes.  Pertinent labs & imaging results that were available during my care of the patient were reviewed by me and considered in my medical decision making (see chart for details).    MDM Rules/Calculators/A&P                           35 yo M with a chief complaint of left ankle pain after fall.  Most likely ankle sprain based on exam.  Obtain a plain film.  Reassess.  Plain film viewed by me without fracture.  ASO crutches PCP follow-up.  8:56 AM:  I have discussed the diagnosis/risks/treatment options with the patient and believe the pt to be eligible for discharge home to follow-up with PCP. We also discussed returning to the ED immediately if new or worsening sx occur. We discussed the sx which are most concerning (e.g., sudden worsening pain, fever, inability to tolerate by mouth) that necessitate immediate return.  Medications administered to the patient during their visit and any new prescriptions provided to the patient are listed below.  Medications given during this visit Medications  acetaminophen (TYLENOL) tablet 1,000 mg (1,000 mg Oral Given 11/22/20 0813)  ibuprofen (ADVIL) tablet 800 mg (800 mg Oral Given 11/22/20 0813)  oxyCODONE (Oxy IR/ROXICODONE) immediate release tablet 5 mg (5 mg Oral Given 11/22/20 0813)     The patient appears reasonably screen and/or stabilized  for discharge and I doubt any other medical condition or other [facility] requiring further screening, evaluation, or treatment in the ED at this time prior to discharge.   Final Clinical Impression(s) / ED Diagnoses Final diagnoses:  Sprain of left ankle, unspecified ligament, initial encounter    Rx / DC Orders ED Discharge Orders     None        Melene Plan, DO 11/22/20 [phone]

## 2020-11-22 NOTE — ED Triage Notes (Signed)
Patient stepped in a hole in his yard and injured left ankle.

## 2020-11-22 NOTE — Discharge Instructions (Addendum)
Take 4 over the counter ibuprofen tablets 3 times a day or 2 over-the-counter naproxen tablets twice a day for pain. Also take tylenol 1000mg (2 extra strength) four times a day.   Your x-ray does not show a broken bone.  Please follow-up with your family doctor.  I have given you information for the sports medicine doctor that works upstairs if you would like to see him in the office.

## 2020-11-23 ENCOUNTER — Encounter (HOSPITAL_BASED_OUTPATIENT_CLINIC_OR_DEPARTMENT_OTHER): Payer: Self-pay | Admitting: *Deleted

## 2020-11-23 ENCOUNTER — Emergency Department (HOSPITAL_BASED_OUTPATIENT_CLINIC_OR_DEPARTMENT_OTHER)
Admission: EM | Admit: 2020-11-23 | Discharge: 2020-11-23 | Disposition: A | Discharge status: Home / Self Care | Payer: Self-pay | Attending: Emergency Medicine | Admitting: Emergency Medicine

## 2020-11-23 ENCOUNTER — Other Ambulatory Visit: Payer: Self-pay

## 2020-11-23 DIAGNOSIS — X501XXD Overexertion from prolonged static or awkward postures, subsequent encounter: Secondary | ICD-10-CM | POA: Insufficient documentation

## 2020-11-23 DIAGNOSIS — S93402D Sprain of unspecified ligament of left ankle, subsequent encounter: Secondary | ICD-10-CM | POA: Insufficient documentation

## 2020-11-23 MED ORDER — OXYCODONE-ACETAMINOPHEN 5-325 MG PO TABS
1.0000 | ORAL_TABLET | Freq: Once | ORAL | Status: AC
  Administered 2020-11-23: 1 via ORAL
  Filled 2020-11-23: qty 1

## 2020-11-23 MED ORDER — OXYCODONE-ACETAMINOPHEN 5-325 MG PO TABS: 1.0000 | ORAL_TABLET | Freq: Four times a day (QID) | ORAL | 0 refills | Status: AC | PRN

## 2020-11-23 NOTE — ED Notes (Signed)
Discharge instructions discussed with pt. Pt verbalized understanding. Pt stable and ambulatory.  °

## 2020-11-23 NOTE — Discharge Instructions (Addendum)
Recommend naproxen or Motrin and Tylenol for pain control.  For breakthrough pain can take the prescribed Percocet.  Note this can make you drowsy should not be taken while driving or operating heavy machinery.  Recommend using brace, bearing weight as tolerated.  Use crutches as needed.  Follow-up with Ortho or sports medicine.

## 2020-11-23 NOTE — ED Triage Notes (Signed)
Pt was seen for ankle sprain yesterday and is back due to continued pain

## 2020-11-23 NOTE — ED Provider Notes (Signed)
[facility] EMERGENCY DEPARTMENT Provider Note   CSN: [id number] Arrival date & time: 11/23/20  2595     History Chief Complaint  Patient presents with   Ankle Pain    Mark Bryant is a 35 y.o. male.  Presents to ER with concern for ankle pain.  Patient came to ER yesterday for ankle sprain after twisting his ankle and came back today due to ongoing pain.  Per review of chart x-rays yesterday were negative.  Patient has been able to bear weight but has some pain.  Has been using ankle brace as instructed.  Pain is moderate, worse with movement, improved with rest.  Has not followed up with Ortho yet.  HPI     History reviewed. No pertinent past medical history.  There are no problems to display for this patient.   History reviewed. No pertinent surgical history.     No family history on file.  Social History   Tobacco Use   Smoking status: Never   Smokeless tobacco: Never  Vaping Use   Vaping Use: Never used  Substance Use Topics   Alcohol use: Yes   Drug use: Yes    Types: Marijuana    Home Medications Prior to Admission medications   Medication Sig Start Date End Date Taking? Authorizing Provider  oxyCODONE-acetaminophen (PERCOCET/ROXICET) 5-325 MG tablet Take 1 tablet by mouth every 6 (six) hours as needed for up to 3 days for severe pain. 11/23/20 11/26/20 Yes Dabid Godown, Quitman Livings, MD  methocarbamol (ROBAXIN) 500 MG tablet Take 1 tablet (500 mg total) by mouth 2 (two) times daily. 07/17/20   Gailen Shelter, PA  naproxen (NAPROSYN) 500 MG tablet Take 1 tablet (500 mg total) by mouth 2 (two) times daily with a meal. 01/29/18   Robinson, [place] N, PA-C    Allergies    Patient has no known allergies.  Review of Systems   Review of Systems  Musculoskeletal:  Positive for arthralgias.  All other systems reviewed and are negative.  Physical Exam Updated Vital Signs BP (!) 145/110 (BP Location: Right Arm)   Pulse 92   Temp 98.3 F (36.8 C) (Oral)    Resp 18   Wt 136.1 kg   SpO2 97%   BMI 38.52 kg/m   Physical Exam Vitals and nursing note reviewed.  Constitutional:      Appearance: He is well-developed.  HENT:     Head: Normocephalic and atraumatic.  Eyes:     Conjunctiva/sclera: Conjunctivae normal.  Cardiovascular:     Rate and Rhythm: Normal rate and regular rhythm.     Heart sounds: No murmur heard. Pulmonary:     Effort: Pulmonary effort is normal. No respiratory distress.     Breath sounds: Normal breath sounds.  Abdominal:     Palpations: Abdomen is soft.     Tenderness: There is no abdominal tenderness.  Musculoskeletal:     Cervical back: Neck supple.     Comments: Left ankle: There is some mild swelling and some tenderness over the lateral malleolus but no significant deformity appreciated, normal DP/PT pulse  Skin:    General: Skin is warm and dry.  Neurological:     General: No focal deficit present.     Mental Status: He is alert.    ED Results / Procedures / Treatments   Labs (all labs ordered are listed, but only abnormal results are displayed) Labs Reviewed - No data to display  EKG None  Radiology DG Ankle Complete Left  Result Date: 11/22/2020 CLINICAL DATA:  Left ankle pain after a fall EXAM: LEFT ANKLE COMPLETE - 3+ VIEW COMPARISON:  None. FINDINGS: No acute fracture or dislocation. Base of fifth metatarsal and talar dome intact. No significant soft tissue swelling. IMPRESSION: No acute osseous abnormality. Electronically Signed   By: Jeronimo Greaves M.D.   On: 11/22/2020 08:52    Procedures Procedures   Medications Ordered in ED Medications  oxyCODONE-acetaminophen (PERCOCET/ROXICET) 5-325 MG per tablet 1 tablet (1 tablet Oral Given 11/23/20 1726)    ED Course  I have reviewed the triage vital signs and the nursing notes.  Pertinent labs & imaging results that were available during my care of the patient were reviewed by me and considered in my medical decision making (see chart for  details).    MDM Rules/Calculators/A&P                           35 y/o male patient came back to ER for left ankle sprain.  X-ray yesterday negative.  Recommended weightbearing as tolerated, utilizing ankle brace and crutches as needed.  Recommend follow-up with Ortho.  Provided very short Rx for Percocet.  After the discussed management above, the patient was determined to be safe for discharge.  The patient was in agreement with this plan and all questions regarding their care were answered.  ED return precautions were discussed and the patient will return to the ED with any significant worsening of condition.  Final Clinical Impression(s) / ED Diagnoses Final diagnoses:  Sprain of left ankle, unspecified ligament, subsequent encounter    Rx / DC Orders ED Discharge Orders          Ordered    oxyCODONE-acetaminophen (PERCOCET/ROXICET) 5-325 MG tablet  Every 6 hours PRN        11/23/20 1737             Milagros Loll, MD 11/23/20 2338

## 2021-08-07 ENCOUNTER — Other Ambulatory Visit: Payer: Self-pay | Admitting: Internal Medicine

## 2021-08-07 ENCOUNTER — Other Ambulatory Visit (HOSPITAL_COMMUNITY): Payer: Self-pay | Admitting: Internal Medicine

## 2021-08-07 DIAGNOSIS — M545 Low back pain, unspecified: Secondary | ICD-10-CM

## 2021-08-10 ENCOUNTER — Other Ambulatory Visit (HOSPITAL_COMMUNITY): Payer: Self-pay | Admitting: Internal Medicine

## 2021-08-10 DIAGNOSIS — R519 Headache, unspecified: Secondary | ICD-10-CM

## 2021-09-04 ENCOUNTER — Ambulatory Visit (HOSPITAL_COMMUNITY)
Admission: RE | Admit: 2021-09-04 | Discharge: 2021-09-04 | Disposition: A | Discharge status: Home / Self Care | Payer: No Typology Code available for payment source | Source: Ambulatory Visit | Attending: Internal Medicine | Admitting: Internal Medicine

## 2021-09-04 ENCOUNTER — Other Ambulatory Visit: Payer: Self-pay

## 2021-09-04 ENCOUNTER — Emergency Department (HOSPITAL_COMMUNITY)
Admission: EM | Admit: 2021-09-04 | Discharge: 2021-09-05 | Disposition: A | Discharge status: Home / Self Care | Payer: No Typology Code available for payment source | Attending: Emergency Medicine | Admitting: Emergency Medicine

## 2021-09-04 ENCOUNTER — Encounter (HOSPITAL_COMMUNITY): Payer: Self-pay

## 2021-09-04 DIAGNOSIS — G8929 Other chronic pain: Secondary | ICD-10-CM | POA: Insufficient documentation

## 2021-09-04 DIAGNOSIS — M545 Low back pain, unspecified: Secondary | ICD-10-CM | POA: Diagnosis present

## 2021-09-04 DIAGNOSIS — R519 Headache, unspecified: Secondary | ICD-10-CM | POA: Insufficient documentation

## 2021-09-04 MED ORDER — GADOBUTROL 1 MMOL/ML IV SOLN
10.0000 mL | Freq: Once | INTRAVENOUS | Status: AC | PRN
  Administered 2021-09-04: 10 mL via INTRAVENOUS

## 2021-09-04 NOTE — ED Notes (Signed)
in MRI

## 2021-09-04 NOTE — ED Triage Notes (Addendum)
Pt reports he was told to come here to have MRI of head and back . Pt reports he was at the [place] and was unable to complete the scan bc he is claustrophobic. Pt reports he has been having headaches, and back pain since being in the [employer]. Pt reports he was only sent here bc outpatient services closed

## 2021-09-04 NOTE — ED Provider Notes (Signed)
  [facility] EMERGENCY DEPARTMENT Provider Note   CSN: [id number] Arrival date & time: 09/04/21  2132     History  No chief complaint on file.   Mark Bryant is a 36 y.o. male.  The history is provided by the patient.   patient reports he has had headache and back pain since 2008 due to injuries from [employer].  He was seen by the [facility] and was sent for outpatient MRI brain and back.  He reports that this is all part of process to obtain disability  There are no acute issues tonight    Home Medications Prior to Admission medications   Medication Sig Start Date End Date Taking? Authorizing Provider  methocarbamol (ROBAXIN) 500 MG tablet Take 1 tablet (500 mg total) by mouth 2 (two) times daily. 07/17/20   Gailen Shelter, PA  naproxen (NAPROSYN) 500 MG tablet Take 1 tablet (500 mg total) by mouth 2 (two) times daily with a meal. 01/29/18   Robinson, [place] N, PA-C      Allergies    Patient has no known allergies.    Review of Systems   Review of Systems  Musculoskeletal:  Positive for back pain.  Neurological:  Positive for headaches.   Physical Exam Updated Vital Signs BP (!) 135/100   Pulse 86   Temp 98.2 F (36.8 C) (Oral)   Resp 20   SpO2 100%  Physical Exam CONSTITUTIONAL: Well developed/well nourished HEAD: Normocephalic/atraumatic ENMT: Mucous membranes moist NECK: supple no meningeal signs NEURO: Pt is awake/alert/appropriate, moves all extremitiesx4.  No facial droop.  Patient ambulating EXTREMITIES:full ROM SKIN: warm, color normal PSYCH: no abnormalities of mood noted, alert and oriented to situation  ED Results / Procedures / Treatments   Labs (all labs ordered are listed, but only abnormal results are displayed) Labs Reviewed - No data to display  EKG None  Radiology No results found.  Procedures Procedures    Medications Ordered in ED Medications - No data to display  ED Course/ Medical Decision  Making/ A&P                           Medical Decision Making  Patient was sent to this hospital for outpatient MRI but ended up going to the emergency department.  The MRIs of his brain and lumbar spine were ordered by a [facility] physician.  They are being performed as a routine read which means they would not likely be read until next 24 to 48 hours  Patient is requesting discharge, and since there is no acute issues at this time, he can follow-up as an outpatient        Final Clinical Impression(s) / ED Diagnoses Final diagnoses:  Chronic nonintractable headache, unspecified headache type  Chronic back pain, unspecified back location, unspecified back pain laterality    Rx / DC Orders ED Discharge Orders     None         Zadie Rhine, MD 09/04/21 2357

## 2021-12-17 ENCOUNTER — Encounter (HOSPITAL_BASED_OUTPATIENT_CLINIC_OR_DEPARTMENT_OTHER): Payer: Self-pay | Admitting: Emergency Medicine

## 2021-12-17 ENCOUNTER — Other Ambulatory Visit: Payer: Self-pay

## 2021-12-17 ENCOUNTER — Emergency Department (HOSPITAL_BASED_OUTPATIENT_CLINIC_OR_DEPARTMENT_OTHER)
Admission: EM | Admit: 2021-12-17 | Discharge: 2021-12-18 | Disposition: A | Discharge status: Home / Self Care | Payer: No Typology Code available for payment source | Attending: Emergency Medicine | Admitting: Emergency Medicine

## 2021-12-17 ENCOUNTER — Emergency Department (HOSPITAL_BASED_OUTPATIENT_CLINIC_OR_DEPARTMENT_OTHER): Payer: No Typology Code available for payment source

## 2021-12-17 DIAGNOSIS — R1011 Right upper quadrant pain: Secondary | ICD-10-CM | POA: Insufficient documentation

## 2021-12-17 DIAGNOSIS — E119 Type 2 diabetes mellitus without complications: Secondary | ICD-10-CM | POA: Insufficient documentation

## 2021-12-17 DIAGNOSIS — I10 Essential (primary) hypertension: Secondary | ICD-10-CM | POA: Insufficient documentation

## 2021-12-17 DIAGNOSIS — R059 Cough, unspecified: Secondary | ICD-10-CM | POA: Insufficient documentation

## 2021-12-17 DIAGNOSIS — R0602 Shortness of breath: Secondary | ICD-10-CM | POA: Insufficient documentation

## 2021-12-17 DIAGNOSIS — R079 Chest pain, unspecified: Secondary | ICD-10-CM | POA: Insufficient documentation

## 2021-12-17 DIAGNOSIS — R1013 Epigastric pain: Secondary | ICD-10-CM | POA: Insufficient documentation

## 2021-12-17 LAB — BASIC METABOLIC PANEL
Anion gap: 6 (ref 5–15)
BUN: 13 mg/dL (ref 6–20)
CO2: 25 mmol/L (ref 22–32)
Calcium: 8.9 mg/dL (ref 8.9–10.3)
Chloride: 106 mmol/L (ref 98–111)
Creatinine, Ser: 1.04 mg/dL (ref 0.61–1.24)
GFR, Estimated: >60 mL/min (ref >60)
Glucose, Bld: 123 mg/dL — ABNORMAL HIGH (ref 70–99)
Potassium: 3.8 mmol/L (ref 3.5–5.1)
Sodium: 137 mmol/L (ref 135–145)

## 2021-12-17 LAB — CBC
HCT: 40.9 % (ref 39.0–52.0)
Hemoglobin: 12.9 g/dL — ABNORMAL LOW (ref 13.0–17.0)
MCH: 27.7 pg (ref 26.0–34.0)
MCHC: 31.5 g/dL (ref 30.0–36.0)
MCV: 88 fL (ref 80.0–100.0)
Platelets: 260 10*3/uL (ref 150–400)
RBC: 4.65 MIL/uL (ref 4.22–5.81)
RDW: 12.8 % (ref 11.5–15.5)
WBC: 8.5 10*3/uL (ref 4.0–10.5)
nRBC: 0 % (ref 0.0–0.2)

## 2021-12-17 LAB — TROPONIN I (HIGH SENSITIVITY): Troponin I (High Sensitivity): 12 ng/L (ref <18)

## 2021-12-17 MED ORDER — PANTOPRAZOLE SODIUM 40 MG IV SOLR
40.0000 mg | Freq: Once | INTRAVENOUS | Status: AC
  Administered 2021-12-17: 40 mg via INTRAVENOUS
  Filled 2021-12-17: qty 10

## 2021-12-17 NOTE — ED Provider Notes (Signed)
[facility] EMERGENCY DEPARTMENT Provider Note   CSN: [id number] Arrival date & time: 12/17/21  2132     History {Add pertinent medical, surgical, social history, OB history to HPI:1} Chief Complaint  Patient presents with   Chest Pain   Abdominal Pain    Mark Bryant is a 36 y.o. male.  The history is provided by the patient and medical records.  Chest Pain Associated symptoms: abdominal pain   Abdominal Pain Associated symptoms: chest pain   Mark Bryant is a 36 y.o. male who presents to the Emergency Department complaining of chest pain and abdominal pain.    Chest pain started today, abdominal pain has been ongoing for a while.    Chest pain - central/epigastric.  Pain is stabbing, comes and goes.  Nonradiating.  No clear alleviating or worsening factors.  Abdominal pain - several months - upper and RUQ.  Pain is sharp, worse with leaning forward.  Pain has been worsening over time. No change with food/drink.    No fever.  Feels hard to breathe starting today.  Has mild cough.  Has nausea, no vomiting.  No diarrhea, constipation.  No dysuria.  No lower extremity edema.     Hx/o TBI, arthritis, PTSD.  No hx/o DM, HTN, clotting disorder.  No tobacco, alcohol, drug use.       Home Medications Prior to Admission medications   Medication Sig Start Date End Date Taking? Authorizing Provider  methocarbamol (ROBAXIN) 500 MG tablet Take 1 tablet (500 mg total) by mouth 2 (two) times daily. 07/17/20   Gailen Shelter, PA  naproxen (NAPROSYN) 500 MG tablet Take 1 tablet (500 mg total) by mouth 2 (two) times daily with a meal. 01/29/18   Robinson, [place] N, PA-C      Allergies    Patient has no known allergies.    Review of Systems   Review of Systems  Cardiovascular:  Positive for chest pain.  Gastrointestinal:  Positive for abdominal pain.  All other systems reviewed and are negative.   Physical Exam Updated Vital Signs BP 125/81   Pulse 78   Temp  98.1 F (36.7 C) (Oral)   Resp 18   Ht 5\' 11"  (1.803 m)   Wt (!) 138.3 kg   SpO2 98%   BMI 42.54 kg/m  Physical Exam Vitals and nursing note reviewed.  Constitutional:      Appearance: He is well-developed.  HENT:     Head: Normocephalic and atraumatic.  Cardiovascular:     Rate and Rhythm: Normal rate and regular rhythm.     Heart sounds: No murmur heard. Pulmonary:     Effort: Pulmonary effort is normal. No respiratory distress.     Breath sounds: Normal breath sounds.  Abdominal:     Palpations: Abdomen is soft.     Tenderness: There is no guarding or rebound.     Comments: Moderate RUQ tenderness  Musculoskeletal:        General: No swelling or tenderness.     Comments: 2+ DP pulses bilaterally  Skin:    General: Skin is warm and dry.  Neurological:     Mental Status: He is alert and oriented to person, place, and time.  Psychiatric:        Behavior: Behavior normal.     ED Results / Procedures / Treatments   Labs (all labs ordered are listed, but only abnormal results are displayed) Labs Reviewed  BASIC METABOLIC PANEL - Abnormal; Notable for the following  components:      Result Value   Glucose, Bld 123 (*)    All other components within normal limits  CBC - Abnormal; Notable for the following components:   Hemoglobin 12.9 (*)    All other components within normal limits  TROPONIN I (HIGH SENSITIVITY)  TROPONIN I (HIGH SENSITIVITY)    EKG EKG Interpretation  Date/Time:  Thursday December 17 2021 21:46:32 EDT Ventricular Rate:  79 PR Interval:  146 QRS Duration: 82 QT Interval:  376 QTC Calculation: 431 R Axis:   30 Text Interpretation: Normal sinus rhythm Normal ECG Confirmed by Tilden Fossa [phone]) on 12/17/2021 11:12:59 PM  Radiology DG Chest 2 View  Result Date: 12/17/2021 CLINICAL DATA:  Chest pain EXAM: CHEST - 2 VIEW COMPARISON:  Chest x-ray 09/16/2020 FINDINGS: The heart size and mediastinal contours are within normal limits. Both  lungs are clear. The visualized skeletal structures are unremarkable. IMPRESSION: No active cardiopulmonary disease. Electronically Signed   By: Darliss Cheney M.D.   On: 12/17/2021 21:59    Procedures Procedures  {Document cardiac monitor, telemetry assessment procedure when appropriate:1}  Medications Ordered in ED Medications - No data to display  ED Course/ Medical Decision Making/ A&P                           Medical Decision Making Amount and/or Complexity of Data Reviewed Labs: ordered. Radiology: ordered.   ***  {Document critical care time when appropriate:1} {Document review of labs and clinical decision tools ie heart score, Chads2Vasc2 etc:1}  {Document your independent review of radiology images, and any outside records:1} {Document your discussion with family members, caretakers, and with consultants:1} {Document social determinants of health affecting pt's care:1} {Document your decision making why or why not admission, treatments were needed:1} Final Clinical Impression(s) / ED Diagnoses Final diagnoses:  None    Rx / DC Orders ED Discharge Orders     None

## 2021-12-17 NOTE — ED Triage Notes (Signed)
Patient arrived via POV c/o chest and abdominal pain x several months. [facility]. Patient states sharp pain in left chest, radiation across to right, denies other radiation. Patient states pain is 8/10 pain. Patient endorses nausea. Patient is AO x 4, VS WDL, wheelchair.

## 2021-12-18 LAB — HEPATIC FUNCTION PANEL
ALT: 34 U/L (ref 0–44)
AST: 43 U/L — ABNORMAL HIGH (ref 15–41)
Albumin: 4 g/dL (ref 3.5–5.0)
Alkaline Phosphatase: 58 U/L (ref 38–126)
Bilirubin, Direct: 0.1 mg/dL (ref 0.0–0.2)
Indirect Bilirubin: 0.8 mg/dL (ref 0.3–0.9)
Total Bilirubin: 0.9 mg/dL (ref 0.3–1.2)
Total Protein: 7.5 g/dL (ref 6.5–8.1)

## 2021-12-18 LAB — TROPONIN I (HIGH SENSITIVITY): Troponin I (High Sensitivity): 11 ng/L (ref <18)

## 2021-12-18 LAB — LIPASE, BLOOD: Lipase: 33 U/L (ref 11–51)

## 2021-12-18 MED ORDER — LIDOCAINE VISCOUS HCL 2 % MT SOLN
15.0000 mL | Freq: Once | OROMUCOSAL | Status: AC
  Administered 2021-12-18: 15 mL via ORAL
  Filled 2021-12-18: qty 15

## 2021-12-18 MED ORDER — ALUM & MAG HYDROXIDE-SIMETH 200-200-20 MG/5ML PO SUSP
30.0000 mL | Freq: Once | ORAL | Status: AC
  Administered 2021-12-18: 30 mL via ORAL
  Filled 2021-12-18: qty 30

## 2021-12-18 MED ORDER — PANTOPRAZOLE SODIUM 40 MG PO TBEC: 40.0000 mg | DELAYED_RELEASE_TABLET | Freq: Every day | ORAL | 0 refills | Status: AC

## 2022-02-21 ENCOUNTER — Encounter (HOSPITAL_BASED_OUTPATIENT_CLINIC_OR_DEPARTMENT_OTHER): Payer: Self-pay

## 2022-02-21 ENCOUNTER — Emergency Department (HOSPITAL_BASED_OUTPATIENT_CLINIC_OR_DEPARTMENT_OTHER)
Admission: EM | Admit: 2022-02-21 | Discharge: 2022-02-21 | Disposition: A | Discharge status: Home / Self Care | Payer: No Typology Code available for payment source | Attending: Emergency Medicine | Admitting: Emergency Medicine

## 2022-02-21 ENCOUNTER — Other Ambulatory Visit: Payer: Self-pay

## 2022-02-21 DIAGNOSIS — Z1152 Encounter for screening for COVID-19: Secondary | ICD-10-CM | POA: Insufficient documentation

## 2022-02-21 DIAGNOSIS — J205 Acute bronchitis due to respiratory syncytial virus: Secondary | ICD-10-CM

## 2022-02-21 DIAGNOSIS — R0602 Shortness of breath: Secondary | ICD-10-CM | POA: Diagnosis present

## 2022-02-21 LAB — RESP PANEL BY RT-PCR (FLU A&B, COVID) ARPGX2
Influenza A by PCR: NEGATIVE
Influenza B by PCR: NEGATIVE
SARS Coronavirus 2 by RT PCR: NEGATIVE

## 2022-02-21 MED ORDER — ALBUTEROL SULFATE HFA 108 (90 BASE) MCG/ACT IN AERS
2.0000 | INHALATION_SPRAY | RESPIRATORY_TRACT | Status: DC | PRN
  Administered 2022-02-21: 2 via RESPIRATORY_TRACT
  Filled 2022-02-21: qty 6.7

## 2022-02-21 MED ORDER — DEXAMETHASONE SODIUM PHOSPHATE 10 MG/ML IJ SOLN
10.0000 mg | Freq: Once | INTRAMUSCULAR | Status: AC
  Administered 2022-02-21: 10 mg via INTRAMUSCULAR
  Filled 2022-02-21: qty 1

## 2022-02-21 NOTE — ED Triage Notes (Signed)
PT self ambulated to exam 3 c/o cough and chest congestion x 2 days. Pt VSS NAD PT on room air speaking in full clear sentences. Denies fever chills at this time.

## 2022-02-21 NOTE — ED Provider Notes (Signed)
[facility] Provider Note: Georgena Spurling, MD, FACEP  CSN: [id number] MRN: [id number] ARRIVAL: 02/21/22 at [place]: [place]  02/21/22 3:20 AM Mark Bryant is a 36 y.o. male with 3 days of shortness of breath and chest tightness.  He denies cough or fever but has had nasal congestion and loss of taste and smell.  Symptoms are worse with exertion.  He denies frank pain.   History reviewed. No pertinent past medical history.  History reviewed. No pertinent surgical history.  History reviewed. No pertinent family history.  Social History   Tobacco Use   Smoking status: Never   Smokeless tobacco: Never  Vaping Use   Vaping Use: Never used  Substance Use Topics   Alcohol use: Not Currently   Drug use: Not Currently    Types: Marijuana    Prior to Admission medications   Medication Sig Start Date End Date Taking? Authorizing Provider  pantoprazole (PROTONIX) 40 MG tablet Take 1 tablet (40 mg total) by mouth daily. 12/18/21   Quintella Reichert, MD    Allergies Patient has no known allergies.   REVIEW OF SYSTEMS  Negative except as noted here or in the History of Present Illness.   PHYSICAL EXAMINATION  Initial Vital Signs Blood pressure (!) 143/106, pulse 100, temperature 98.4 F (36.9 C), temperature source Oral, resp. rate 18, weight (!) 138.3 kg, SpO2 99 %.  Examination General: Well-developed, well-nourished male in no acute distress; appearance consistent with age of record HENT: normocephalic; atraumatic Eyes: Normal appearance Neck: supple Heart: regular rate and rhythm Lungs: Expiratory wheezes Abdomen: soft; nondistended; nontender; bowel sounds present Extremities: No deformity; full range of motion; pulses normal Neurologic: Awake, alert and oriented; motor function intact in all extremities and symmetric; no facial droop Skin: Warm and dry Psychiatric: Normal mood  and affect   RESULTS  Summary of this visit's results, reviewed and interpreted by myself:   EKG Interpretation  Date/Time:    Ventricular Rate:    PR Interval:    QRS Duration:   QT Interval:    QTC Calculation:   R Axis:     Text Interpretation:         Laboratory Studies: Results for orders placed or performed during the hospital encounter of 02/21/22 (from the past 24 hour(s))  Resp Panel by RT-PCR (Flu A&B, Covid) Anterior Nasal Swab     Status: None   Collection Time: 02/21/22  3:51 AM   Specimen: Anterior Nasal Swab  Result Value Ref Range   SARS Coronavirus 2 by RT PCR NEGATIVE NEGATIVE   Influenza A by PCR NEGATIVE NEGATIVE   Influenza B by PCR NEGATIVE NEGATIVE   Imaging Studies: No results found.  ED COURSE and MDM  Nursing notes, initial and subsequent vitals signs, including pulse oximetry, reviewed and interpreted by myself.  Vitals:   02/21/22 0247 02/21/22 0255 02/21/22 0331 02/21/22 0357  BP: (!) 143/106   (!) 143/100  Pulse: 100   79  Resp: 18   18  Temp: 98.4 F (36.9 C)     TempSrc: Oral     SpO2: 99%  98% 96%  Weight:  (!) 138.3 kg     Medications  albuterol (VENTOLIN HFA) 108 (90 Base) MCG/ACT inhaler 2 puff (2 puffs Inhalation Given 02/21/22 0328)  dexamethasone (DECADRON) injection 10 mg (10 mg Intramuscular Given 02/21/22 0400)   4:38 AM Although not shown above, the  patient's PCR did show positive for respiratory syncytial virus.  He was given an albuterol inhaler and AeroChamber and instructed in their use.  He was also given a dose of dexamethasone.    PROCEDURES  Procedures   ED DIAGNOSES     ICD-10-CM   1. Acute bronchitis due to respiratory syncytial virus (RSV)  J20.5          Estel Scholze, MD 02/21/22 [phone]

## 2024-02-04 IMAGING — MR MR LUMBAR SPINE W/O CM
4 of 5 series · 19 of 48 positions shown · non-contrast
Comparison: None Available.

CLINICAL DATA: Initial evaluation for lower back pain.

EXAM:
MRI LUMBAR SPINE WITHOUT CONTRAST
TECHNIQUE: Multiplanar, multisequence MR imaging of the lumbar spine was
performed. No intravenous contrast was administered.

[Series 2: T2 · sagittal · 4.0mm · 0.55mm/px · 6 of 15 slices shown (1 of 2)]
[im 1/15]
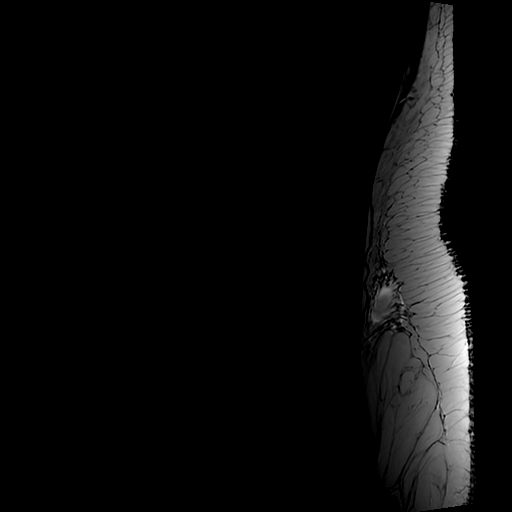
[im 3/15]
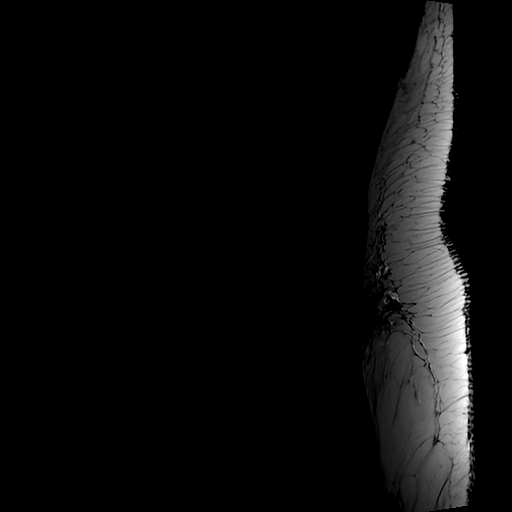
[im 6/15]
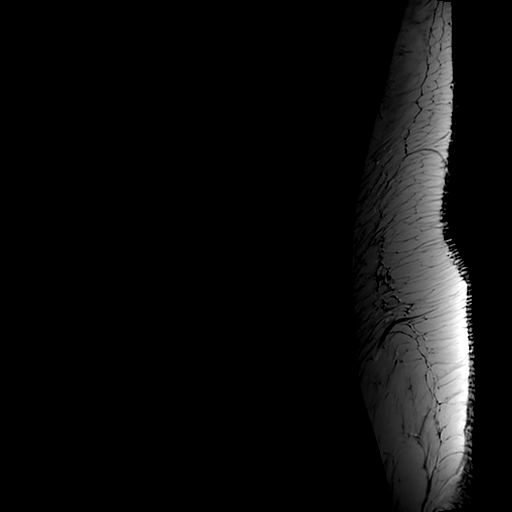
[im 9/15]
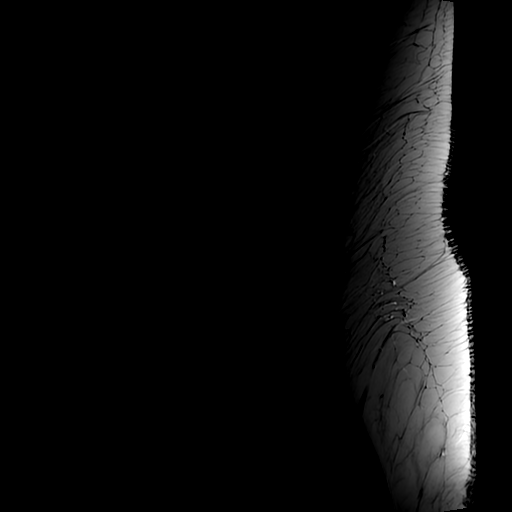
[im 12/15]
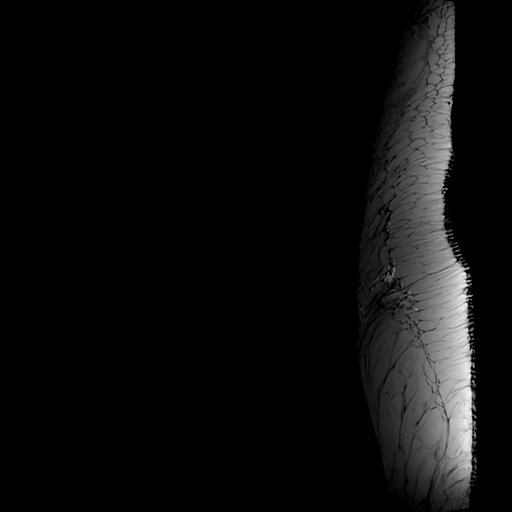
[im 15/15]
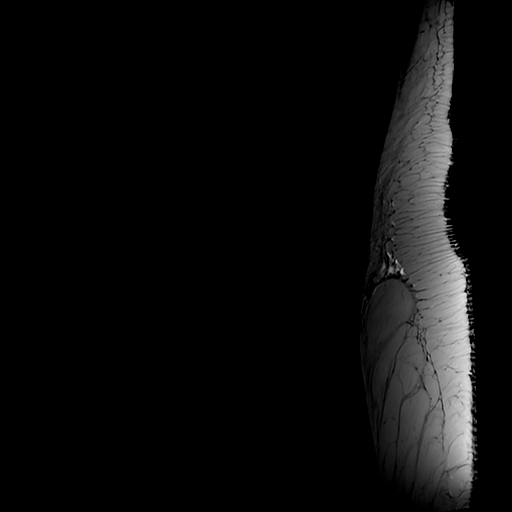

[Series 4: T1 · sagittal · 4.0mm · 0.55mm/px · 3 of 15 slices shown (1 of 2)]
[im 1/15]
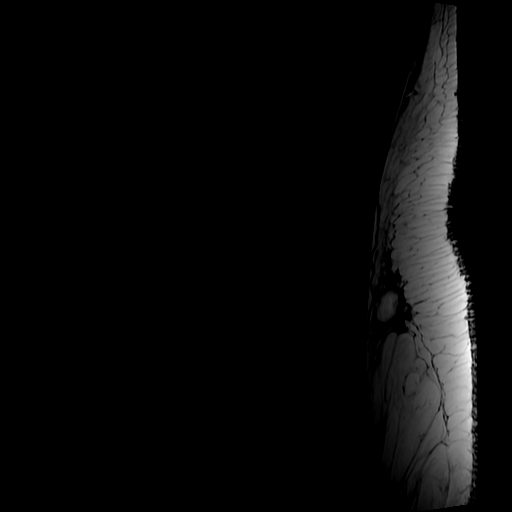
[im 8/15]
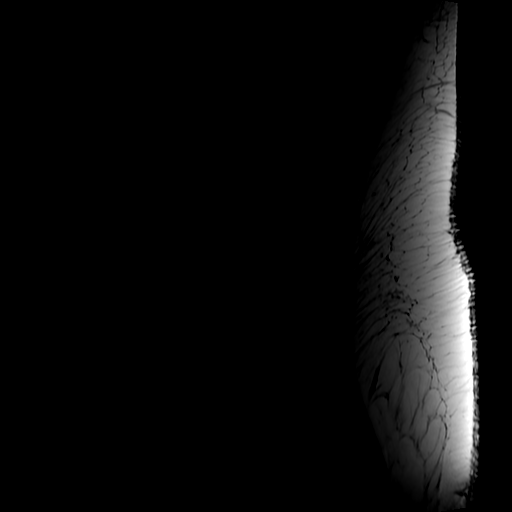
[im 15/15]
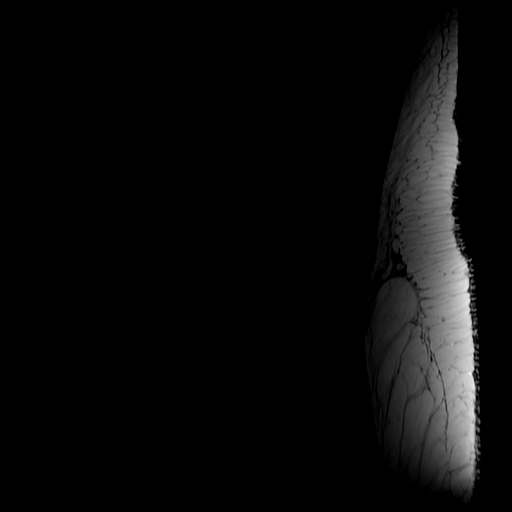

[Series 5: T2 · axial · 4.0mm · 0.39mm/px · z∈[-87,+93]mm · 7 of 46 slices shown (2 of 2)]
[im 4/46]
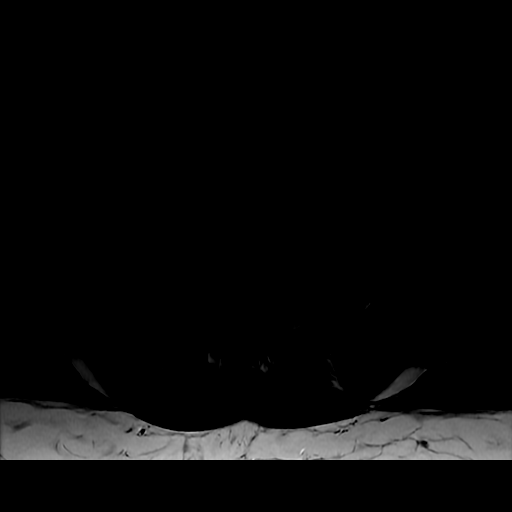
[im 7/46]
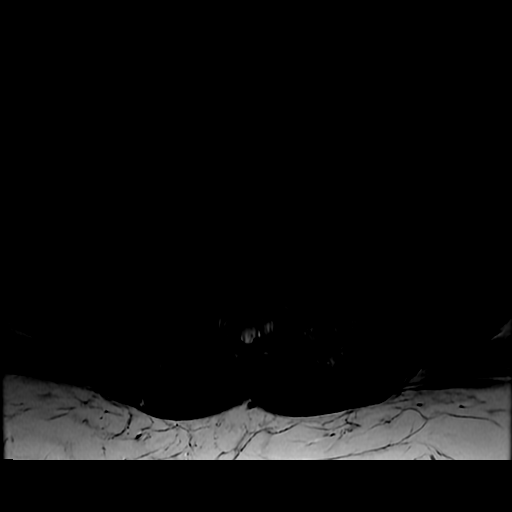
[im 10/46]
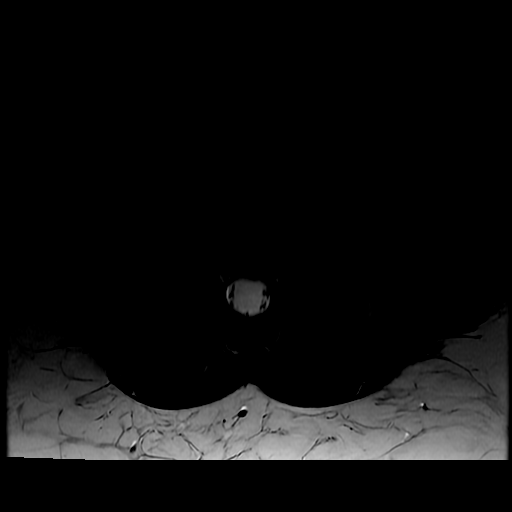
[im 16/46]
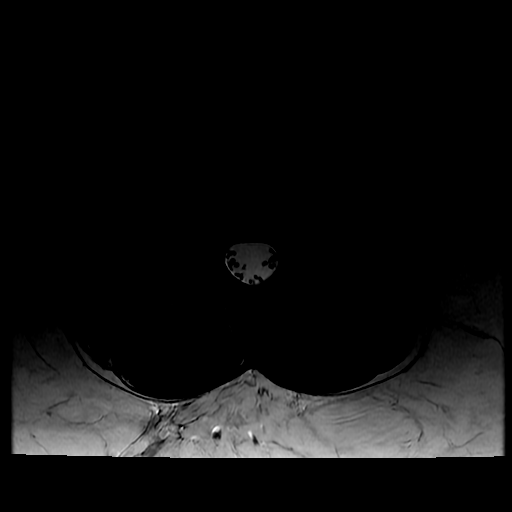
[im 22/46]
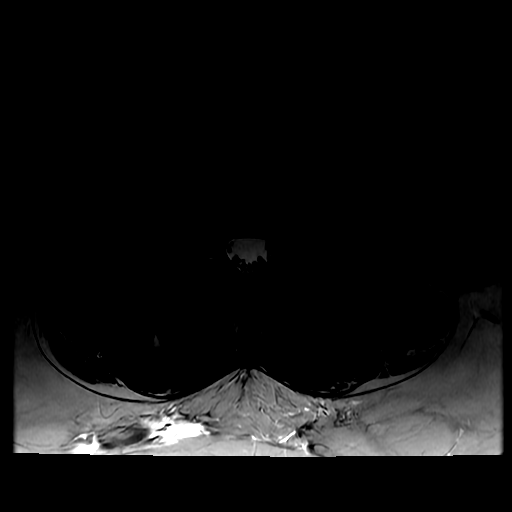
[im 25/46]
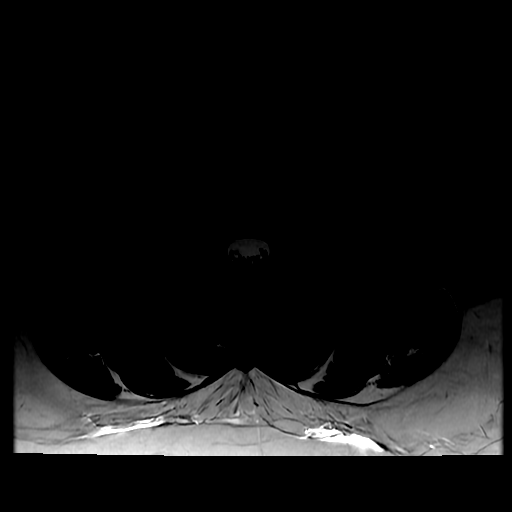
[im 40/46]
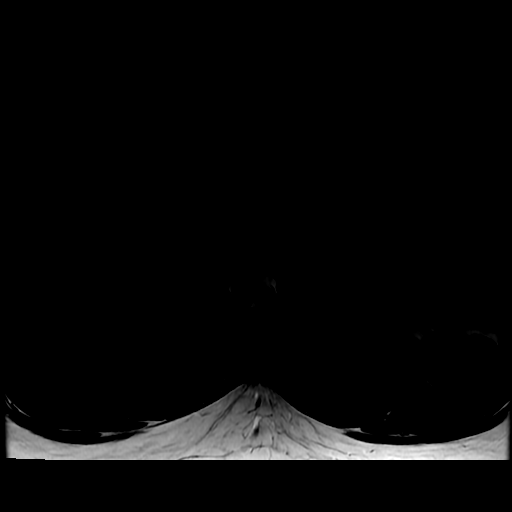

[Series 6: T1 · axial · 4.0mm · 0.39mm/px · z∈[-72,+93]mm · 3 of 46 slices shown (2 of 2)]
[im 7/46]
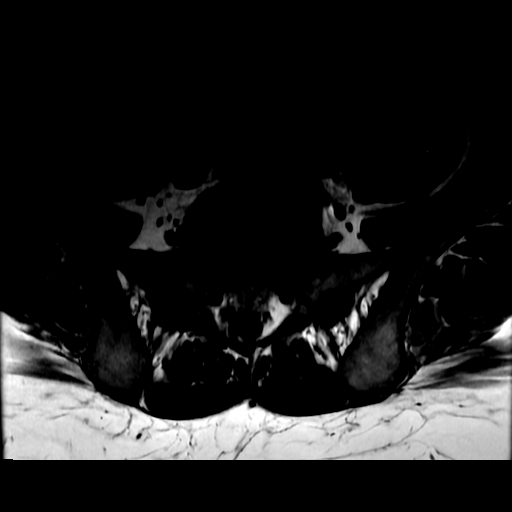
[im 25/46]
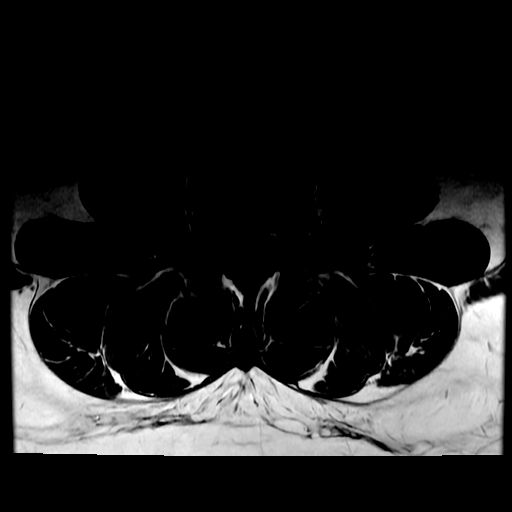
[im 40/46]
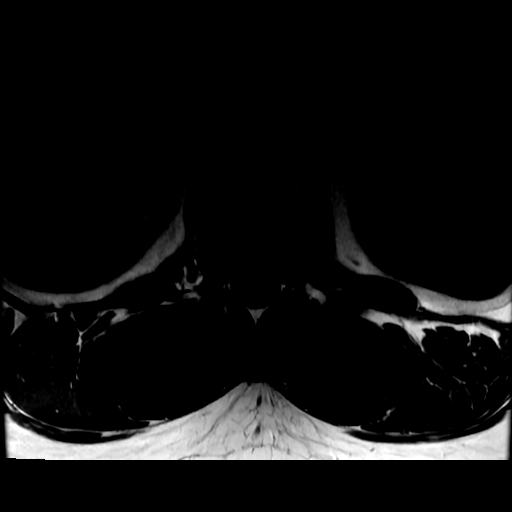

[19 of 48 positions shown; findings below may reference images not displayed]

FINDINGS: Segmentation: Standard. Lowest well-formed disc space labeled the
L5-S1 level.

Alignment: Physiologic with preservation of the normal lumbar
lordosis. No listhesis.

Vertebrae: Vertebral body height maintained without acute or chronic
fracture. Bone marrow signal intensity within normal limits. No
discrete or worrisome osseous lesions. No abnormal marrow edema.

Conus medullaris and cauda equina: Conus extends to the L1 level.
Conus and cauda equina appear normal.

Paraspinal and other soft tissues: Unremarkable.

Disc levels:

No significant disc pathology seen within the lumbar spine.
Intervertebral discs are well hydrated with preserved disc height.
Mild bilateral facet hypertrophy at L3-4 through L5-S1. No
significant spinal stenosis. Foramina remain patent. No neural
impingement.
IMPRESSION: 1. Mild bilateral facet hypertrophy at L3-4 through L5-S1.
2. Otherwise unremarkable MRI of the lumbar spine. No significant
disc pathology, stenosis, or neural impingement.
